# Patient Record
Sex: Male | Born: 1960 | Race: White | Hispanic: No | Marital: Married | Smoking: Never smoker
Health system: Southern US, Community
[De-identification: ages and names within clinical notes are randomized; demographics above are authoritative.]

## PROBLEM LIST (undated history)

## (undated) DIAGNOSIS — L509 Urticaria, unspecified: Secondary | ICD-10-CM

## (undated) DIAGNOSIS — Z91018 Allergy to other foods: Secondary | ICD-10-CM

## (undated) HISTORY — DX: Urticaria, unspecified: L50.9

## (undated) HISTORY — PX: BICEPS TENDON REPAIR: SHX566

## (undated) HISTORY — PX: COLONOSCOPY: SHX174

## (undated) HISTORY — DX: Allergy to other foods: Z91.018

---

## 2003-03-26 ENCOUNTER — Emergency Department (HOSPITAL_COMMUNITY): Admission: EM | Admit: 2003-03-26 | Discharge: 2003-03-26 | Payer: Self-pay | Admitting: Emergency Medicine

## 2005-05-16 IMAGING — CR DG CHEST 1V PORT
1 series · 1 of 1 positions shown · non-contrast
Comparison: none

CLINICAL DATA: Chest pain.  
 PORTABLE CHEST 03/26/03 
 The heart size and mediastinal contours are within normal limits. The lungs are clear.

 IMPRESSION
 No acute disease.

[view not recorded]
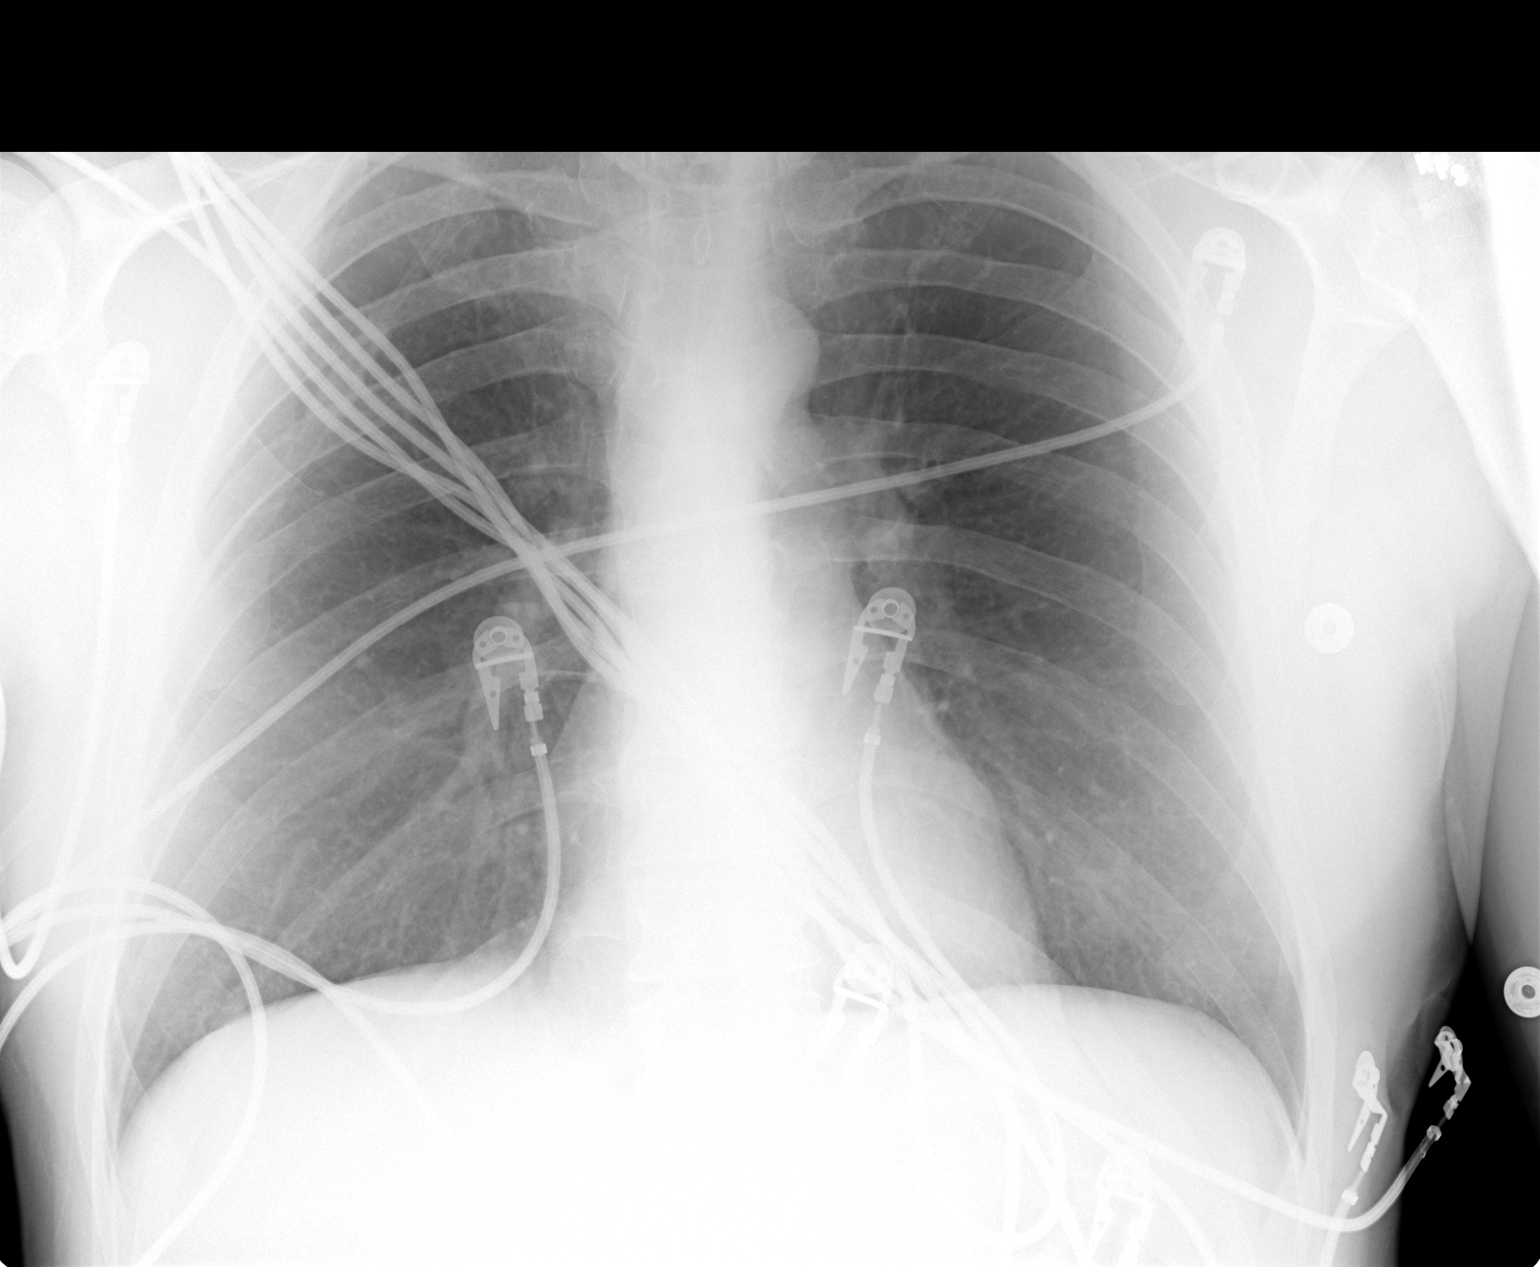

[1 of 1 positions shown; findings below may reference images not displayed]

## 2019-11-24 ENCOUNTER — Encounter: Payer: Self-pay | Admitting: Allergy and Immunology

## 2019-11-24 ENCOUNTER — Ambulatory Visit (INDEPENDENT_AMBULATORY_CARE_PROVIDER_SITE_OTHER): Payer: BC Managed Care – PPO | Admitting: Allergy and Immunology

## 2019-11-24 ENCOUNTER — Other Ambulatory Visit: Payer: Self-pay

## 2019-11-24 VITALS — BP 138/88 | HR 60 | Resp 14 | Ht 70.6 in | Wt 216.0 lb

## 2019-11-24 DIAGNOSIS — J301 Allergic rhinitis due to pollen: Secondary | ICD-10-CM | POA: Diagnosis not present

## 2019-11-24 DIAGNOSIS — T7840XA Allergy, unspecified, initial encounter: Secondary | ICD-10-CM

## 2019-11-24 DIAGNOSIS — L501 Idiopathic urticaria: Secondary | ICD-10-CM | POA: Diagnosis not present

## 2019-11-24 DIAGNOSIS — T783XXA Angioneurotic edema, initial encounter: Secondary | ICD-10-CM

## 2019-11-24 NOTE — Patient Instructions (Addendum)
  1.  Continue loratadine 10 mg - 1-2 tablets 1-2 times per day  2.  Blood - CBC w/D, CMP, TSH, FT4, TP, area 2 aeroallergen profile, alpha gal panel, C4  3.  Further evaluation and treatment?  4.  Contact clinic in 2 weeks with update  5.  Recommend obtaining flu vaccine and Covid vaccine

## 2019-11-24 NOTE — Progress Notes (Signed)
Cody Mann   NEW PATIENT NOTE  Referring Provider: No ref. provider found Primary Provider: No primary care provider on file. Date of office visit: 11/24/2019    Subjective:   Chief Complaint:  Cody Mann (DOB: 13-Jul-1960) is a 59 y.o. male who presents to the clinic on 11/24/2019 with a chief complaint of Urticaria .  HPI: Cody Mann presents to this clinic in evaluation of urticaria.  Apparently 3 months ago he started to develop palm and feet itching followed by urticaria that has globalized during this timeframe.  Most of his lesions appear to show up in the evening.  They are itchy.  He has no associated systemic or constitutional symptoms.  His lesions never heal with scar or hyperpigmentation and do not last greater than 24 hours.  There is no obvious provoking factor giving rise to this issue.  He has had a lot more outdoor exposure to weeds during this timeframe.  He does have a history of some nasal congestion and sneezing but his respiratory tract issue associated with atopic disease is minimal and he does not require any therapy for this issue.  Treatment has included a visit with [facility] dermatology who treated him with a prednisone taper which he finished 2 days ago which has helped his issue significantly.  However, when he stopped his prednisone 2 days ago he did have a flare of his urticaria yesterday but none today.  He has also been using loratadine.  Apparently when he was at the age of 59 he was stung by bee and developed hives but no other associated systemic or constitutional symptoms.  He does have a history of having some intermittent lip swelling with 4 events in his entire life.  He has been evaluated for decreased energy over the course the past 18 months by checking a testosterone level, results unknown.  Past Medical History:  Diagnosis Date  . Urticaria     Past Surgical History:  Procedure  Laterality Date  . BICEPS TENDON REPAIR Right     Allergies as of 11/24/2019   No Known Allergies     Medication List    none     Review of systems negative except as noted in HPI / PMHx or noted below:  Review of Systems  Constitutional: Negative.   HENT: Negative.   Eyes: Negative.   Respiratory: Negative.   Cardiovascular: Negative.   Gastrointestinal: Negative.   Genitourinary: Negative.   Musculoskeletal: Negative.   Skin: Negative.   Neurological: Negative.   Endo/Heme/Allergies: Negative.   Psychiatric/Behavioral: Negative.     Family History  Problem Relation Age of Onset  . Lymphoma Father        Diffuse Large B-Cell Lymphoma  . Breast cancer Paternal Aunt   . Cancer Paternal Uncle     Social History   Socioeconomic History  . Marital status: Single    Spouse name: Not on file  . Number of children: Not on file  . Years of education: Not on file  . Highest education level: Not on file  Occupational History  . Not on file  Tobacco Use  . Smoking status: Never Smoker  . Smokeless tobacco: Former Network engineer and Sexual Activity  . Alcohol use: Yes    Comment: Rare  . Drug use: Never  . Sexual activity: Not on file  Other Topics Concern  . Not on file  Social History Narrative  . Not  on file    Environmental and Social history  Lives in a house with a dry environment, dog located inside the household, no carpet in the bedroom, no plastic on the bed, no plastic on the pillow, and no smoking ongoing with inside the household.  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  Objective:   Vitals:   11/24/19 1424 11/24/19 1518  BP: (!) 136/92 138/88  Pulse: 60   Resp: 14   SpO2: 96%    Height: 5' 10.6" (179.3 cm) Weight: 216 lb (98 kg)  Physical Exam Constitutional:      Appearance: He is not diaphoretic.  HENT:     Head: Normocephalic. No right periorbital erythema or left periorbital erythema.     Right Ear: Tympanic  membrane, ear canal and external ear normal.     Left Ear: Tympanic membrane, ear canal and external ear normal.     Nose: Nose normal. No mucosal edema or rhinorrhea.     Mouth/Throat:     Pharynx: Uvula midline. No oropharyngeal exudate.  Eyes:     General: Lids are normal.     Conjunctiva/sclera: Conjunctivae normal.     Pupils: Pupils are equal, round, and reactive to light.  Neck:     Thyroid: No thyromegaly.     Trachea: Trachea normal. No tracheal tenderness or tracheal deviation.  Cardiovascular:     Rate and Rhythm: Normal rate and regular rhythm.     Heart sounds: Normal heart sounds, S1 normal and S2 normal. No murmur heard.   Pulmonary:     Effort: Pulmonary effort is normal. No respiratory distress.     Breath sounds: Normal breath sounds. No stridor. No wheezing or rales.  Chest:     Chest wall: No tenderness.  Abdominal:     General: There is no distension.     Palpations: Abdomen is soft. There is no mass.     Tenderness: There is no abdominal tenderness. There is no guarding or rebound.  Musculoskeletal:        General: No tenderness.  Lymphadenopathy:     Head:     Right side of head: No tonsillar adenopathy.     Left side of head: No tonsillar adenopathy.     Cervical: No cervical adenopathy.  Skin:    Coloration: Skin is not pale.     Findings: No erythema or rash.     Nails: There is no clubbing.  Neurological:     Mental Status: He is alert.     Diagnostics: Allergy skin tests were not performed secondary to the recent use of an antihistamine  Assessment and Plan:    1. Allergic reaction, initial encounter   2. Idiopathic urticaria   3. Angioedema, initial encounter   4. Seasonal allergic rhinitis due to pollen     1.  Continue loratadine 10 mg - 1-2 tablets 1-2 times per day  2.  Blood - CBC w/D, CMP, TSH, FT4, TP, area 2 aeroallergen profile, alpha gal panel, C4  3.  Further evaluation and treatment?  4.  Contact clinic in 2 weeks with  update  5.  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  We will screen his blood for worrisome systemic disease contributing to these issues and also evaluate him for atopic disease.  He can continue  to use loratadine up to a dose of 40 mg daily if required.  I will contact him with the results of his blood test once they are available for review.  Allena Katz, MD Allergy / Immunology [place]

## 2019-11-25 ENCOUNTER — Encounter: Payer: Self-pay | Admitting: Allergy and Immunology

## 2019-11-27 LAB — CBC WITH DIFFERENTIAL/PLATELET
Basophils Absolute: 0 10*3/uL (ref 0.0–0.2)
Basos: 0 %
Eos: 2 %
Hemoglobin: 14.6 g/dL (ref 13.0–17.7)
Immature Grans (Abs): 0.1 10*3/uL (ref 0.0–0.1)
Immature Granulocytes: 1 %
MCH: 28.2 pg (ref 26.6–33.0)
MCHC: 32.7 g/dL (ref 31.5–35.7)
Monocytes: 7 %
RBC: 5.18 x10E6/uL (ref 4.14–5.80)

## 2019-11-27 LAB — ALPHA-GAL PANEL

## 2019-11-27 LAB — ALLERGENS W/TOTAL IGE AREA 2
Cladosporium Herbarum IgE: <0.1 kU/L
Common Silver Birch IgE: <0.1 kU/L
D Pteronyssinus IgE: 2.99 kU/L — AB
Johnson Grass IgE: 0.47 kU/L — AB

## 2019-11-27 LAB — COMPREHENSIVE METABOLIC PANEL
BUN: 18 mg/dL (ref 6–24)
Calcium: 9.8 mg/dL (ref 8.7–10.2)
Potassium: 4.9 mmol/L (ref 3.5–5.2)

## 2019-12-03 LAB — ALPHA-GAL PANEL
Beef (Bos spp) IgE: 1.84 kU/L — ABNORMAL HIGH (ref <0.35)
Class Interpretation: 2
Class Interpretation: 2
Lamb/Mutton (Ovis spp) IgE: 0.3 kU/L (ref <0.35)
Pork (Sus spp) IgE: 0.76 kU/L — ABNORMAL HIGH (ref <0.35)

## 2019-12-03 LAB — COMPREHENSIVE METABOLIC PANEL
ALT: 34 IU/L (ref 0–44)
AST: 20 IU/L (ref 0–40)
Albumin/Globulin Ratio: 2.1 (ref 1.2–2.2)
Albumin: 4.7 g/dL (ref 3.8–4.9)
Alkaline Phosphatase: 56 IU/L (ref 44–121)
BUN/Creatinine Ratio: 17 (ref 9–20)
Bilirubin Total: 0.4 mg/dL (ref 0.0–1.2)
CO2: 27 mmol/L (ref 20–29)
Chloride: 98 mmol/L (ref 96–106)
Creatinine, Ser: 1.07 mg/dL (ref 0.76–1.27)
GFR calc Af Amer: 89 mL/min/{1.73_m2} (ref >59)
GFR calc non Af Amer: 77 mL/min/{1.73_m2} (ref >59)
Globulin, Total: 2.2 g/dL (ref 1.5–4.5)
Glucose: 94 mg/dL (ref 65–99)
Sodium: 139 mmol/L (ref 134–144)
Total Protein: 6.9 g/dL (ref 6.0–8.5)

## 2019-12-03 LAB — C4 COMPLEMENT: Complement C4, Serum: 32 mg/dL (ref 12–38)

## 2019-12-03 LAB — ALLERGENS W/TOTAL IGE AREA 2
Alternaria Alternata IgE: <0.1 kU/L
Aspergillus Fumigatus IgE: <0.1 kU/L
Bermuda Grass IgE: 0.84 kU/L — AB
Cat Dander IgE: 0.14 kU/L — AB
Cedar, Mountain IgE: 0.77 kU/L — AB
Cockroach, German IgE: 5.12 kU/L — AB
Cottonwood IgE: 0.36 kU/L — AB
D Farinae IgE: 2.62 kU/L — AB
Dog Dander IgE: 0.21 kU/L — AB
Elm, American IgE: 0.17 kU/L — AB
IgE (Immunoglobulin E), Serum: 415 IU/mL (ref 6–495)
Maple/Box Elder IgE: 0.18 kU/L — AB
Mouse Urine IgE: <0.1 kU/L
Oak, White IgE: 0.85 kU/L — AB
Pecan, Hickory IgE: 0.67 kU/L — AB
Penicillium Chrysogen IgE: <0.1 kU/L
Pigweed, Rough IgE: 0.71 kU/L — AB
Ragweed, Short IgE: 1.62 kU/L — AB
Sheep Sorrel IgE Qn: 0.25 kU/L — AB
Timothy Grass IgE: 4.2 kU/L — AB
White Mulberry IgE: <0.1 kU/L

## 2019-12-03 LAB — CBC WITH DIFFERENTIAL/PLATELET
EOS (ABSOLUTE): 0.2 10*3/uL (ref 0.0–0.4)
Hematocrit: 44.6 % (ref 37.5–51.0)
Lymphocytes Absolute: 2.6 10*3/uL (ref 0.7–3.1)
Lymphs: 29 %
MCV: 86 fL (ref 79–97)
Monocytes Absolute: 0.6 10*3/uL (ref 0.1–0.9)
Neutrophils Absolute: 5.6 10*3/uL (ref 1.4–7.0)
Neutrophils: 61 %
Platelets: 262 10*3/uL (ref 150–450)
RDW: 13.5 % (ref 11.6–15.4)
WBC: 9.2 10*3/uL (ref 3.4–10.8)

## 2019-12-03 LAB — THYROID PEROXIDASE ANTIBODY: Thyroperoxidase Ab SerPl-aCnc: <8 IU/mL (ref 0–34)

## 2019-12-03 LAB — TSH+FREE T4
Free T4: 1.22 ng/dL (ref 0.82–1.77)
TSH: 3.48 u[IU]/mL (ref 0.450–4.500)

## 2019-12-07 ENCOUNTER — Telehealth: Payer: Self-pay | Admitting: Allergy and Immunology

## 2019-12-07 ENCOUNTER — Other Ambulatory Visit: Payer: Self-pay | Admitting: *Deleted

## 2019-12-07 MED ORDER — EPINEPHRINE 0.3 MG/0.3ML IJ SOAJ: INTRAMUSCULAR | 3 refills | Status: AC

## 2019-12-07 NOTE — Telephone Encounter (Signed)
Cody Mann and his wife have called several times requesting lab results.

## 2019-12-27 ENCOUNTER — Encounter: Payer: Self-pay | Admitting: Allergy and Immunology

## 2019-12-27 ENCOUNTER — Ambulatory Visit (INDEPENDENT_AMBULATORY_CARE_PROVIDER_SITE_OTHER): Payer: BC Managed Care – PPO | Admitting: Allergy and Immunology

## 2019-12-27 ENCOUNTER — Other Ambulatory Visit: Payer: Self-pay

## 2019-12-27 VITALS — BP 126/88 | HR 72 | Resp 16

## 2019-12-27 DIAGNOSIS — J301 Allergic rhinitis due to pollen: Secondary | ICD-10-CM | POA: Diagnosis not present

## 2019-12-27 DIAGNOSIS — T783XXD Angioneurotic edema, subsequent encounter: Secondary | ICD-10-CM

## 2019-12-27 DIAGNOSIS — T7840XD Allergy, unspecified, subsequent encounter: Secondary | ICD-10-CM

## 2019-12-27 DIAGNOSIS — L501 Idiopathic urticaria: Secondary | ICD-10-CM

## 2019-12-27 NOTE — Patient Instructions (Addendum)
  1.  Use loratadine 10 mg - 1-2 tablets 1-2 times per day  2.  If loratadine ineffective, then use cetirizine 10 mg up to 40 mg daily  3.  Xolair administration if fail medical treatment  4.  Contact clinic with update

## 2019-12-27 NOTE — Progress Notes (Signed)
   [place] [facility] [place] [facility] [place]   Follow-up Note  Referring Provider: Enid Skeens., MD Primary Provider: Enid Skeens., MD Date of Office Visit: 12/27/2019  Subjective:   Cody Mann (DOB: [birth year]) is a 59 y.o. male who returns to the [facility] [place] on 12/27/2019 in re-evaluation of the following:  HPI: Cody Mann returns to this clinic in evaluation of recurrent allergic reactions and urticaria and angioedema and a history of seasonal allergic rhinitis.  His last visit to this clinic was his initial evaluation of 24 November 2019.  He continues to have urticaria on a daily basis mostly at nighttime and mostly involving his lower extremities and pelvic region that is disturbing his sleep.  He has not really started any antihistamines.  He has performed complete avoidance measures regarding consumption of mammal and dairy.  Allergies as of 12/27/2019   No Known Allergies     Medication List      EPINEPHrine 0.3 mg/0.3 mL Soaj injection Commonly known as: EPI-PEN Use as directed for life threatening allergic reactions       Past Medical History:  Diagnosis Date  . Allergy to alpha-gal   . Urticaria     Past Surgical History:  Procedure Laterality Date  . BICEPS TENDON REPAIR Right     Review of systems negative except as noted in HPI / PMHx or noted below:  Review of Systems  Constitutional: Negative.   HENT: Negative.   Eyes: Negative.   Respiratory: Negative.   Cardiovascular: Negative.   Gastrointestinal: Negative.   Genitourinary: Negative.   Musculoskeletal: Negative.   Skin: Negative.   Neurological: Negative.   Endo/Heme/Allergies: Negative.   Psychiatric/Behavioral: Negative.      Objective:   Vitals:   12/27/19 1347  BP: 126/88  Pulse: 72  Resp: 16  SpO2: 95%          Physical Exam-deferred  Diagnostics:   Results of blood tests obtained 24 November 2019 identifies creatinine 1.07  mg/DL, AST 20 U/L, ALT 30 4U/L, WBC 9.2, absolute eosinophil 200, absolute lymphocyte 2600, hemoglobin 14.6, TSH 3.480 IU/mL, free T4 1.22 NG/DL, thyroid peroxidase antibody less than 8 IU/mL alpha gal IgE 3.09 KU/L, beef 1.84 KU/L, pork 0.76 KU/L, multiple aeroallergens demonstrating IgE antibodies including dust mite, cat, dog, pollens with a serum IgE of 415 KU/L   Assessment and Plan:   1. Allergic reaction, subsequent encounter   2. Angioedema, subsequent encounter   3. Idiopathic urticaria   4. Seasonal allergic rhinitis due to pollen     1.  Use loratadine 10 mg - 1-2 tablets 1-2 times per day  2.  If loratadine ineffective, then use cetirizine 10 mg up to 40 mg daily  3.  Xolair administration if fail medical treatment  4.  Contact clinic with update  We will have Cody Mann utilize a collection of H1 receptor blockers as noted above and if he cannot get this issue under good control then we will start him on Xolair.  He will keep [place] updated about his response to therapy as he moves forward.  Allena Katz, MD Allergy / Immunology [place]

## 2019-12-28 ENCOUNTER — Encounter: Payer: Self-pay | Admitting: Allergy and Immunology

## 2019-12-31 ENCOUNTER — Telehealth: Payer: Self-pay

## 2019-12-31 NOTE — Telephone Encounter (Signed)
Patient's wife called and said patient has tried the loratadine and now the Zyrtec and is still having hives. She is not sure how much Zyrtec he is currently taking.   I asked her to make sure patient tries using the Zyrtec two tablets twice daily and let us know on Monday how he does on that dosing.  Wife will have patient use that dosing.

## 2020-01-03 NOTE — Telephone Encounter (Signed)
I will try to get approval and reach out to patient at  this time they may not approve since he has not tried H2 antihistamine or montelukast previously

## 2020-01-03 NOTE — Telephone Encounter (Signed)
Wife called back this morning and states after taking Zyrtec twice daily, the hives are not any better.  Merry Proud still has hives and itching.  Please advise.

## 2020-01-03 NOTE — Telephone Encounter (Signed)
Lets get Cody Mann started on omalizumab for chronic urticaria.

## 2020-01-03 NOTE — Telephone Encounter (Signed)
Patient's wife called back again. She has been informed that Dr. Neldon Mc recommended the omalizumab injections and that someone has reached out to our biologic coordinator. I told the patient's wife that someone would let them know of next steps. In the meantime, do you have any other recommendations for him to do?

## 2020-01-03 NOTE — Telephone Encounter (Signed)
Tammy, please advise of next steps.

## 2020-01-06 NOTE — Telephone Encounter (Signed)
Tammy, Wife called in again and would like a status on where you are in the approval process for Xolair.  Wife states that he is broken out head to toe and is miserable.  I did inform the wife that this is a lengthy process and that you would get back to her as soon as you find something out.  Wife insisted I reach out to you.  Wife would like you to contact her at [phone] because Jeff's phone isn't working and he works out of town.

## 2020-01-06 NOTE — Telephone Encounter (Signed)
Called wife and advised have submitted for approval and let her know when approved

## 2020-01-10 NOTE — Telephone Encounter (Signed)
Have him use the required medications, H1 and H2 receptor blocker on a consistent basis, cetirizine up to 20 mg twice a day and famotidine 20 mg twice a day and when he fails this therapy we can start him on Xolair.

## 2020-01-10 NOTE — Telephone Encounter (Signed)
See below Tammy!

## 2020-01-10 NOTE — Telephone Encounter (Signed)
[place] requiring documentation (per Fax) of 2nd H1 antihistamine or H2 antihistamine and Montelukast ast step therapy for Xolair approval.  Patient has only tried zyrtec in all the notes I have reviewed.  Please advise

## 2020-01-11 MED ORDER — FAMOTIDINE 20 MG PO TABS: 20.0000 mg | ORAL_TABLET | Freq: Two times a day (BID) | ORAL | 3 refills | Status: DC

## 2020-01-11 MED ORDER — MONTELUKAST SODIUM 10 MG PO TABS: 10.0000 mg | ORAL_TABLET | Freq: Every day | ORAL | 5 refills | Status: DC

## 2020-01-11 NOTE — Addendum Note (Signed)
Addended by: Carin Hock on: 01/11/2020 12:33 PM   Modules accepted: Orders

## 2020-01-11 NOTE — Telephone Encounter (Signed)
L/M for patient wife to return call

## 2020-01-11 NOTE — Telephone Encounter (Signed)
Spoke to patient wife and advised step therapy medications and same sent to [facility]

## 2021-05-29 ENCOUNTER — Encounter: Payer: Self-pay | Admitting: Gastroenterology

## 2021-07-05 ENCOUNTER — Ambulatory Visit (AMBULATORY_SURGERY_CENTER): Payer: BC Managed Care – PPO

## 2021-07-05 VITALS — Ht 72.0 in | Wt 200.0 lb

## 2021-07-05 DIAGNOSIS — Z1211 Encounter for screening for malignant neoplasm of colon: Secondary | ICD-10-CM

## 2021-07-05 MED ORDER — NA SULFATE-K SULFATE-MG SULF 17.5-3.13-1.6 GM/177ML PO SOLN: 1.0000 | Freq: Once | ORAL | 0 refills | Status: AC

## 2021-07-05 NOTE — Progress Notes (Signed)
No egg or soy allergy known to patient  No issues known to pt with past sedation with any surgeries or procedures Patient denies ever being told they had issues or difficulty with intubation  No FH of Malignant Hyperthermia Pt is not on diet pills Pt is not on home 02  Pt is not on blood thinners  Pt denies issues with constipation  No A fib or A flutter NO PA's for preps discussed with pt in PV today  Discussed with pt there will be an out-of-pocket cost for prep and that varies from $0 to 70 + dollars - pt verbalized understanding  Pt instructed to use Singlecare.com or GoodRx for a price reduction on prep  PV completed over the phone. Pt verified name, DOB, address and insurance during PV today.   Pt encouraged to call with questions or issues.  Pt has My chart, procedure instructions sent via My Chart  Insurance confirmed with pt at [facility] today

## 2021-07-24 ENCOUNTER — Encounter: Payer: Self-pay | Admitting: Gastroenterology

## 2021-07-25 ENCOUNTER — Telehealth: Payer: Self-pay | Admitting: Gastroenterology

## 2021-07-25 ENCOUNTER — Encounter: Payer: Self-pay | Admitting: *Deleted

## 2021-07-25 NOTE — Telephone Encounter (Signed)
Called patient and left a message. Will resend his prep instructions via mychart.

## 2021-07-25 NOTE — Telephone Encounter (Signed)
Inbound call from patient stating he needs updated prep instruction for upcoming procedure 07/26/21. Patient states he has already picked up prep. Please give patient a call back to advise.  Thank you

## 2021-07-26 ENCOUNTER — Ambulatory Visit (AMBULATORY_SURGERY_CENTER): Payer: BC Managed Care – PPO | Admitting: Gastroenterology

## 2021-07-26 ENCOUNTER — Encounter: Payer: Self-pay | Admitting: Gastroenterology

## 2021-07-26 VITALS — BP 115/70 | HR 60 | Temp 98.4°F | Resp 11 | Ht 72.0 in | Wt 200.0 lb

## 2021-07-26 DIAGNOSIS — D124 Benign neoplasm of descending colon: Secondary | ICD-10-CM | POA: Diagnosis not present

## 2021-07-26 DIAGNOSIS — Z1211 Encounter for screening for malignant neoplasm of colon: Secondary | ICD-10-CM

## 2021-07-26 DIAGNOSIS — K635 Polyp of colon: Secondary | ICD-10-CM | POA: Diagnosis not present

## 2021-07-26 MED ORDER — SODIUM CHLORIDE 0.9 % IV SOLN: 500.0000 mL | Freq: Once | INTRAVENOUS | Status: DC

## 2021-07-26 NOTE — Op Note (Addendum)
Cody Mann Patient Name: Cody Mann Procedure Date: 07/26/2021 11:55 AM MRN: [id number] Endoscopist: Jackquline Denmark , MD Age: 61 Referring MD:  Date of Birth: 05/20/1960 Gender: Male Account #: [id number] Procedure:                Colonoscopy Indications:              Screening for colorectal malignant neoplasm Medicines:                Monitored Anesthesia Care Procedure:                Pre-Anesthesia Assessment:                           - Prior to the procedure, a History and Physical                            was performed, and patient medications and                            allergies were reviewed. The patient's tolerance of                            previous anesthesia was also reviewed. The risks                            and benefits of the procedure and the sedation                            options and risks were discussed with the patient.                            All questions were answered, and informed consent                            was obtained. Prior Anticoagulants: The patient has                            taken no previous anticoagulant or antiplatelet                            agents. ASA Grade Assessment: I - A normal, healthy                            patient. After reviewing the risks and benefits,                            the patient was deemed in satisfactory condition to                            undergo the procedure.                           After obtaining informed consent, the colonoscope  was passed under direct vision. Throughout the                            procedure, the patient's blood pressure, pulse, and                            oxygen saturations were monitored continuously. The                            CF [id number] #[id number] was introduced through the anus                            and advanced to the 2 cm into the ileum. The                            colonoscopy was performed without  difficulty. The                            patient tolerated the procedure well. The quality                            of the bowel preparation was good. The terminal                            ileum, ileocecal valve, appendiceal orifice, and                            rectum were photographed. Scope In: 12:01:06 PM Scope Out: 12:14:00 PM Scope Withdrawal Time: 0 hours 9 minutes 50 seconds  Total Procedure Duration: 0 hours 12 minutes 54 seconds  Findings:                 A 4 mm polyp was found in the proximal descending                            colon. The polyp was sessile. The polyp was removed                            with a cold snare. Resection and retrieval were                            complete.                           Rare small-mouthed diverticula were found in the                            sigmoid colon.                           Non-bleeding internal hemorrhoids were found during                            retroflexion. The hemorrhoids were small and Grade  I (internal hemorrhoids that do not prolapse).                           The terminal ileum appeared normal.                           The exam was otherwise without abnormality on                            direct and retroflexion views. Complications:            No immediate complications. Estimated Blood Loss:     Estimated blood loss: none. Impression:               - One 4 mm polyp in the proximal descending colon,                            removed with a cold snare. Resected and retrieved.                           - Minimal sigmoid diverticulosis.                           - The examined portion of the ileum was normal.                           - The examination was otherwise normal on direct                            and retroflexion views. Recommendation:           - Patient has a contact number available for                            emergencies. The signs and symptoms of  potential                            delayed complications were discussed with the                            patient. Return to normal activities tomorrow.                            Written discharge instructions were provided to the                            patient.                           - Resume previous diet.                           - Continue present medications.                           - Await pathology results.                           -  Repeat colonoscopy for surveillance based on                            pathology results.                           - The findings and recommendations were discussed                            with the patient's family. Jackquline Denmark, MD 07/26/2021 12:17:01 PM This report has been signed electronically.

## 2021-07-26 NOTE — Progress Notes (Signed)
VS completed by DT.  Pt's states no medical or surgical changes since previsit or office visit.  

## 2021-07-26 NOTE — Progress Notes (Signed)
Called to room to assist during endoscopic procedure.  Patient ID and intended procedure confirmed with present staff. Received instructions for my participation in the procedure from the performing physician.  

## 2021-07-26 NOTE — Progress Notes (Signed)
[facility] History and Physical   Primary Care Physician:  Enid Skeens., MD   Reason for Procedure:   Colorectal cancer screening  Plan:     colonoscopy     HPI: Cody Mann is a 61 y.o. male    Past Medical History:  Diagnosis Date   Allergy to alpha-gal    Urticaria     Past Surgical History:  Procedure Laterality Date   BICEPS TENDON REPAIR Right    COLONOSCOPY      Prior to Admission medications   Medication Sig Start Date End Date Taking? Authorizing Provider  EPINEPHrine 0.3 mg/0.3 mL IJ SOAJ injection Use as directed for life threatening allergic reactions Patient not taking: Reported on 07/05/2021 12/07/19   Jiles Prows, MD    Current Outpatient Medications  Medication Sig Dispense Refill   EPINEPHrine 0.3 mg/0.3 mL IJ SOAJ injection Use as directed for life threatening allergic reactions (Patient not taking: Reported on 07/05/2021) 2 each 3   Current Facility-Administered Medications  Medication Dose Route Frequency Provider Last Rate Last Admin   0.9 %  sodium chloride infusion  500 mL Intravenous Once Jackquline Denmark, MD        Allergies as of 07/26/2021   (No Known Allergies)    Family History  Problem Relation Age of Onset   Lymphoma Father        Diffuse Large B-Cell Lymphoma   Breast cancer Paternal Aunt    Cancer Paternal Uncle    Colon polyps Neg Hx    Colon cancer Neg Hx    Esophageal cancer Neg Hx    Stomach cancer Neg Hx    Rectal cancer Neg Hx     Social History   Socioeconomic History   Marital status: Married    Spouse name: Not on file   Number of children: Not on file   Years of education: Not on file   Highest education level: Not on file  Occupational History   Not on file  Tobacco Use   Smoking status: Never   Smokeless tobacco: Former  Scientific laboratory technician Use: Never used  Substance and Sexual Activity   Alcohol use: Yes    Comment: Rare   Drug use: Never   Sexual activity: Not on file   Other Topics Concern   Not on file  Social History Narrative   Not on file   Social Determinants of Health   Financial Resource Strain: Not on file  Food Insecurity: Not on file  Transportation Needs: Not on file  Physical Activity: Not on file  Stress: Not on file  Social Connections: Not on file  Intimate Partner Violence: Not on file    Review of Systems: Positive for none All other review of systems negative except as mentioned in the HPI.  Physical Exam: Vital signs in last 24 hours: '@VSRANGES'$ @   General:   Alert,  Well-developed, well-nourished, pleasant and cooperative in NAD Lungs:  Clear throughout to auscultation.   Heart:  Regular rate and rhythm; no murmurs, clicks, rubs,  or gallops. Abdomen:  Soft, nontender and nondistended. Normal bowel sounds.   Neuro/Psych:  Alert and cooperative. Normal mood and affect. A and O x 3    No significant changes were identified.  The patient continues to be an appropriate candidate for the planned procedure and anesthesia.   Carmell Austria, MD. [facility] 07/26/2021 11:54 AM@

## 2021-07-26 NOTE — Patient Instructions (Signed)
Handouts Provided:  Polyps and Diverticulosis  YOU HAD AN ENDOSCOPIC PROCEDURE TODAY AT [facility]:   Refer to the procedure report that was given to you for any specific questions about what was found during the examination.  If the procedure report does not answer your questions, please call your gastroenterologist to clarify.  If you requested that your care partner not be given the details of your procedure findings, then the procedure report has been included in a sealed envelope for you to review at your convenience later.  YOU SHOULD EXPECT: Some feelings of bloating in the abdomen. Passage of more gas than usual.  Walking can help get rid of the air that was put into your GI tract during the procedure and reduce the bloating. If you had a lower endoscopy (such as a colonoscopy or flexible sigmoidoscopy) you may notice spotting of blood in your stool or on the toilet paper. If you underwent a bowel prep for your procedure, you may not have a normal bowel movement for a few days.  Please Note:  You might notice some irritation and congestion in your nose or some drainage.  This is from the oxygen used during your procedure.  There is no need for concern and it should clear up in a day or so.  SYMPTOMS TO REPORT IMMEDIATELY:  Following lower endoscopy (colonoscopy or flexible sigmoidoscopy):  Excessive amounts of blood in the stool  Significant tenderness or worsening of abdominal pains  Swelling of the abdomen that is new, acute  Fever of 100F or higher  For urgent or emergent issues, a gastroenterologist can be reached at any hour by calling [phone]. Do not use MyChart messaging for urgent concerns.    DIET:  We do recommend a small meal at first, but then you may proceed to your regular diet.  Drink plenty of fluids but you should avoid alcoholic beverages for 24 hours.  ACTIVITY:  You should plan to take it easy for the rest of today and you should NOT DRIVE  or use heavy machinery until tomorrow (because of the sedation medicines used during the test).    FOLLOW UP: Our staff will call the number listed on your records 24-72 hours following your procedure to check on you and address any questions or concerns that you may have regarding the information given to you following your procedure. If we do not reach you, we will leave a message.  We will attempt to reach you two times.  During this call, we will ask if you have developed any symptoms of COVID 19. If you develop any symptoms (ie: fever, flu-like symptoms, shortness of breath, cough etc.) before then, please call [phone].  If you test positive for Covid 19 in the 2 weeks post procedure, please call and report this information to [place].    If any biopsies were taken you will be contacted by phone or by letter within the next 1-3 weeks.  Please call us at [phone] if you have not heard about the biopsies in 3 weeks.    SIGNATURES/CONFIDENTIALITY: You and/or your care partner have signed paperwork which will be entered into your electronic medical record.  These signatures attest to the fact that that the information above on your After Visit Summary has been reviewed and is understood.  Full responsibility of the confidentiality of this discharge information lies with you and/or your care-partner.

## 2021-07-26 NOTE — Progress Notes (Signed)
To pacu, VSS. Report to [email or web address] 

## 2021-07-27 ENCOUNTER — Telehealth: Payer: Self-pay

## 2021-07-27 NOTE — Telephone Encounter (Signed)
  Follow up Call-     07/26/2021   10:49 AM  Call back number  Post procedure Call Back phone  # [phone]  Permission to leave phone message Yes     Patient questions:  Do you have a fever, pain , or abdominal swelling? No. Pain Score  0 *  Have you tolerated food without any problems? Yes.    Have you been able to return to your normal activities? Yes.    Do you have any questions about your discharge instructions: Diet   No. Medications  No. Follow up visit  No.  Do you have questions or concerns about your Care? No.  Actions: * If pain score is 4 or above: No action needed, pain <4.

## 2021-08-04 ENCOUNTER — Encounter: Payer: Self-pay | Admitting: Gastroenterology
